# Patient Record
Sex: Male | Born: 1970 | Race: Black or African American | Hispanic: No | Marital: Married
Health system: Southern US, Community
[De-identification: ages and names within clinical notes are randomized; demographics above are authoritative.]

---

## 2018-07-23 ENCOUNTER — Emergency Department: Payer: 59

## 2018-07-23 ENCOUNTER — Encounter: Payer: Self-pay | Admitting: Emergency Medicine

## 2018-07-23 ENCOUNTER — Emergency Department
Admission: EM | Admit: 2018-07-23 | Discharge: 2018-07-23 | Disposition: A | Discharge status: Home / Self Care | Payer: 59 | Attending: Emergency Medicine | Admitting: Emergency Medicine

## 2018-07-23 DIAGNOSIS — R002 Palpitations: Secondary | ICD-10-CM | POA: Diagnosis present

## 2018-07-23 DIAGNOSIS — R42 Dizziness and giddiness: Secondary | ICD-10-CM | POA: Insufficient documentation

## 2018-07-23 DIAGNOSIS — R202 Paresthesia of skin: Secondary | ICD-10-CM | POA: Insufficient documentation

## 2018-07-23 LAB — CBC
HCT: 41.4 % (ref 40.0–52.0)
HEMOGLOBIN: 14.3 g/dL (ref 13.0–18.0)
MCH: 30.1 pg (ref 26.0–34.0)
MCHC: 34.6 g/dL (ref 32.0–36.0)
MCV: 87 fL (ref 80.0–100.0)
Platelets: 267 10*3/uL (ref 150–440)
RBC: 4.75 MIL/uL (ref 4.40–5.90)
RDW: 14.2 % (ref 11.5–14.5)
WBC: 3.8 10*3/uL (ref 3.8–10.6)

## 2018-07-23 LAB — TSH: TSH: 3.045 u[IU]/mL (ref 0.350–4.500)

## 2018-07-23 LAB — BASIC METABOLIC PANEL
ANION GAP: 8 (ref 5–15)
BUN: 17 mg/dL (ref 6–20)
CALCIUM: 9 mg/dL (ref 8.9–10.3)
CO2: 26 mmol/L (ref 22–32)
Chloride: 104 mmol/L (ref 98–111)
Creatinine, Ser: 1.13 mg/dL (ref 0.61–1.24)
Glucose, Bld: 109 mg/dL — ABNORMAL HIGH (ref 70–99)
Potassium: 3.4 mmol/L — ABNORMAL LOW (ref 3.5–5.1)
Sodium: 138 mmol/L (ref 135–145)

## 2018-07-23 LAB — TROPONIN I

## 2018-07-23 NOTE — Discharge Instructions (Addendum)
Please make an appointment with your primary care physician for follow-up.  Please check your blood pressures daily, as we discussed, and bring the record with you to your primary care physician's appointment.  Please have your primary care physician follow-up the results of your thyroid testing today.  Return to the emergency department if you develop severe pain, lightheadedness or fainting, shortness of breath, fever, or any other symptoms concerning to you.

## 2018-07-23 NOTE — ED Notes (Signed)
Pt presents to ED via POV with c/o chest pressure, nausea, and feeling flushed after getting off of a call at work. Pt reports recently his BP has been slightly increased. Pt states he felt like his heart was racing and he felt flushed. Pt is alert and oriented, NAD noted at this time, skin warm, dry, and intact.

## 2018-07-23 NOTE — ED Triage Notes (Signed)
Pt reports hung up from a conference call at work and started feeling flushed and like his heart was beating fast and then slow and just irregular.

## 2018-07-23 NOTE — ED Notes (Signed)
NAD noted at time of D/C. Pt denies questions or concerns. Pt ambulatory to the lobby at this time.  

## 2018-07-23 NOTE — ED Provider Notes (Signed)
[facility] Emergency Department Provider Note  ____________________________________________  Time seen: Approximately 12:42 PM  I have reviewed the triage vital signs and the nursing notes.   HISTORY  Chief Complaint Irregular Heart Beat; Chest Pain; and Nausea    HPI Caleb Coffey is a 47 y.o. male, otherwise healthy, presenting with palpitations.  The patient reports that he stayed up late last night, and then had a large coffee upon arriving to work.  After phone call, at 1030 this morning, the patient developed central palpitations with bilateral hand tingling and felt lightheaded when he stood up.  He did not have any chest pain, shortness of breath, syncope.  The symptoms have significantly improved.  He went for routine medical screening at his job yesterday and had a blood pressure that was elevated, which has made him worried about hypertension.  However, when he has been using his blood pressure cuff at home, his results have been reassuring.  SH: Denies tobacco or cocaine; works in [employer] at [employer]  FH: Father and grandfather with CAD/stroke in their 7050s and 4260s.  History reviewed. No pertinent past medical history.  There are no active problems to display for this patient.   History reviewed. No pertinent surgical history.    Allergies Patient has no allergy information on record.  No family history on file.  Social History Social History   Tobacco Use  . Smoking status: Not on file  Substance Use Topics  . Alcohol use: Not on file  . Drug use: Not on file    Review of Systems Constitutional: No fever/chills.  Positive lightheadedness, no syncope.  No diaphoresis. Eyes: No visual changes.  No blurred or double vision peer ENT: No sore throat. No congestion or rhinorrhea. Cardiovascular: Denies chest pain.  Positive palpitations. Respiratory: Denies shortness of breath.  No cough. Gastrointestinal: No abdominal pain.  No nausea, no  vomiting.  No diarrhea.  No constipation. Genitourinary: Negative for dysuria. Musculoskeletal: Negative for back pain. Skin: Negative for rash. Neurological: Negative for headaches. No focal numbness, or weakness.  Hand tingling    ____________________________________________   PHYSICAL EXAM:  VITAL SIGNS: ED Triage Vitals  Enc Vitals Group     BP 07/23/18 1046 (!) 164/88     Pulse Rate 07/23/18 1046 92     Resp 07/23/18 1046 20     Temp 07/23/18 1046 98.7 F (37.1 C)     Temp src --      SpO2 07/23/18 1046 100 %     Weight 07/23/18 1040 167 lb (75.8 kg)     Height 07/23/18 1040 5\' 5"  (1.651 m)     Head Circumference --      Peak Flow --      Pain Score 07/23/18 1039 6     Pain Loc --      Pain Edu? --      Excl. in GC? --     Constitutional: Alert and oriented. Answers questions appropriately. Anxious appearing. Eyes: Conjunctivae are normal.  EOMI. No scleral icterus. Head: Atraumatic. Nose: No congestion/rhinnorhea. Mouth/Throat: Mucous membranes are moist.  Neck: No stridor.  Supple.  No JVD. Cardiovascular: Normal rate, regular rhythm. No murmurs, rubs or gallops.  Respiratory: Normal respiratory effort.  No accessory muscle use or retractions. Lungs CTAB.  No wheezes, rales or ronchi. Gastrointestinal: Soft, nontender and nondistended.  No guarding or rebound.  No peritoneal signs. Musculoskeletal: No LE edema. No ttp in the calves or palpable cords.  Negative  Homan's sign. Neurologic:  A&Ox3.  Speech is clear.  Face and smile are symmetric.  EOMI.  Moves all extremities well. Skin:  Skin is warm, dry and intact. No rash noted. Psychiatric: Mood normal; mild anxious affect. Speech and behavior are normal.  Normal judgement.  ____________________________________________   LABS (all labs ordered are listed, but only abnormal results are displayed)  Labs Reviewed  BASIC METABOLIC PANEL - Abnormal; Notable for the following components:      Result Value    Potassium 3.4 (*)    Glucose, Bld 109 (*)    All other components within normal limits  CBC  TROPONIN I  TSH   ____________________________________________  EKG  ED ECG REPORT I, Rockne MenghiniNorman, Anne-Caroline, the attending physician, personally viewed and interpreted this ECG.   Date: 07/23/2018  EKG Time: 1035  Rate: 95  Rhythm: normal sinus rhythm; incomplete RBBB  Axis: normal  Intervals:none  ST&T Change: No STEMI  ____________________________________________  RADIOLOGY  Dg Chest 2 View  Result Date: 07/23/2018 CLINICAL DATA:  Left-sided chest pain. EXAM: CHEST - 2 VIEW COMPARISON:  None. FINDINGS: The heart size and mediastinal contours are within normal limits. Both lungs are clear. The visualized skeletal structures are unremarkable. IMPRESSION: No active cardiopulmonary disease. Electronically Signed   By: Obie DredgeWilliam T Derry M.D.   On: 07/23/2018 11:24    ____________________________________________   PROCEDURES  Procedure(s) performed: None  Procedures  Critical Care performed: No ____________________________________________   INITIAL IMPRESSION / ASSESSMENT AND PLAN / ED COURSE  Pertinent labs & imaging results that were available during my care of the patient were reviewed by me and considered in my medical decision making (see chart for details).  47 y.o. male, otherwise healthy, with some family history of vessel disease, presenting for palpitations, bilateral hand tingling, and lightheadedness.  Overall, the patient is hemodynamically stable and is not hypertensive here; his blood pressure is 136/83 while relaxed and at rest.  His EKG does not show any ischemic changes or arrhythmia.  His laboratory studies are reassuring with normal electrolytes, and no evidence of anemia or elevation in his white blood cell count.  His troponin is negative.  His chest x-ray does not show any acute process.  A thyroid panel is pending and will be followed up with his primary care  physician.  It is possible that the patient had an episode that was related to his sleep deprivation with large caffeine intake.  I do not see any evidence for ACS or MI.  At this time, the patient is safe for discharge home.  He will follow-up with his primary care physician, and return precautions were discussed.  ____________________________________________  FINAL CLINICAL IMPRESSION(S) / ED DIAGNOSES  Final diagnoses:  Paresthesia of both hands  Lightheadedness  Palpitations         NEW MEDICATIONS STARTED DURING THIS VISIT:  New Prescriptions   No medications on file      Rockne MenghiniNorman, Anne-Caroline, MD 07/23/18 1254

## 2019-05-30 IMAGING — CR DG CHEST 2V
1 series · 2 of 2 positions shown · non-contrast
Comparison: None.

CLINICAL DATA: Left-sided chest pain.

EXAM:
CHEST - 2 VIEW

[Series 1: dg chest 2 view · 0.14mm/px · 2 of 2 slices shown]
[im 1/2]
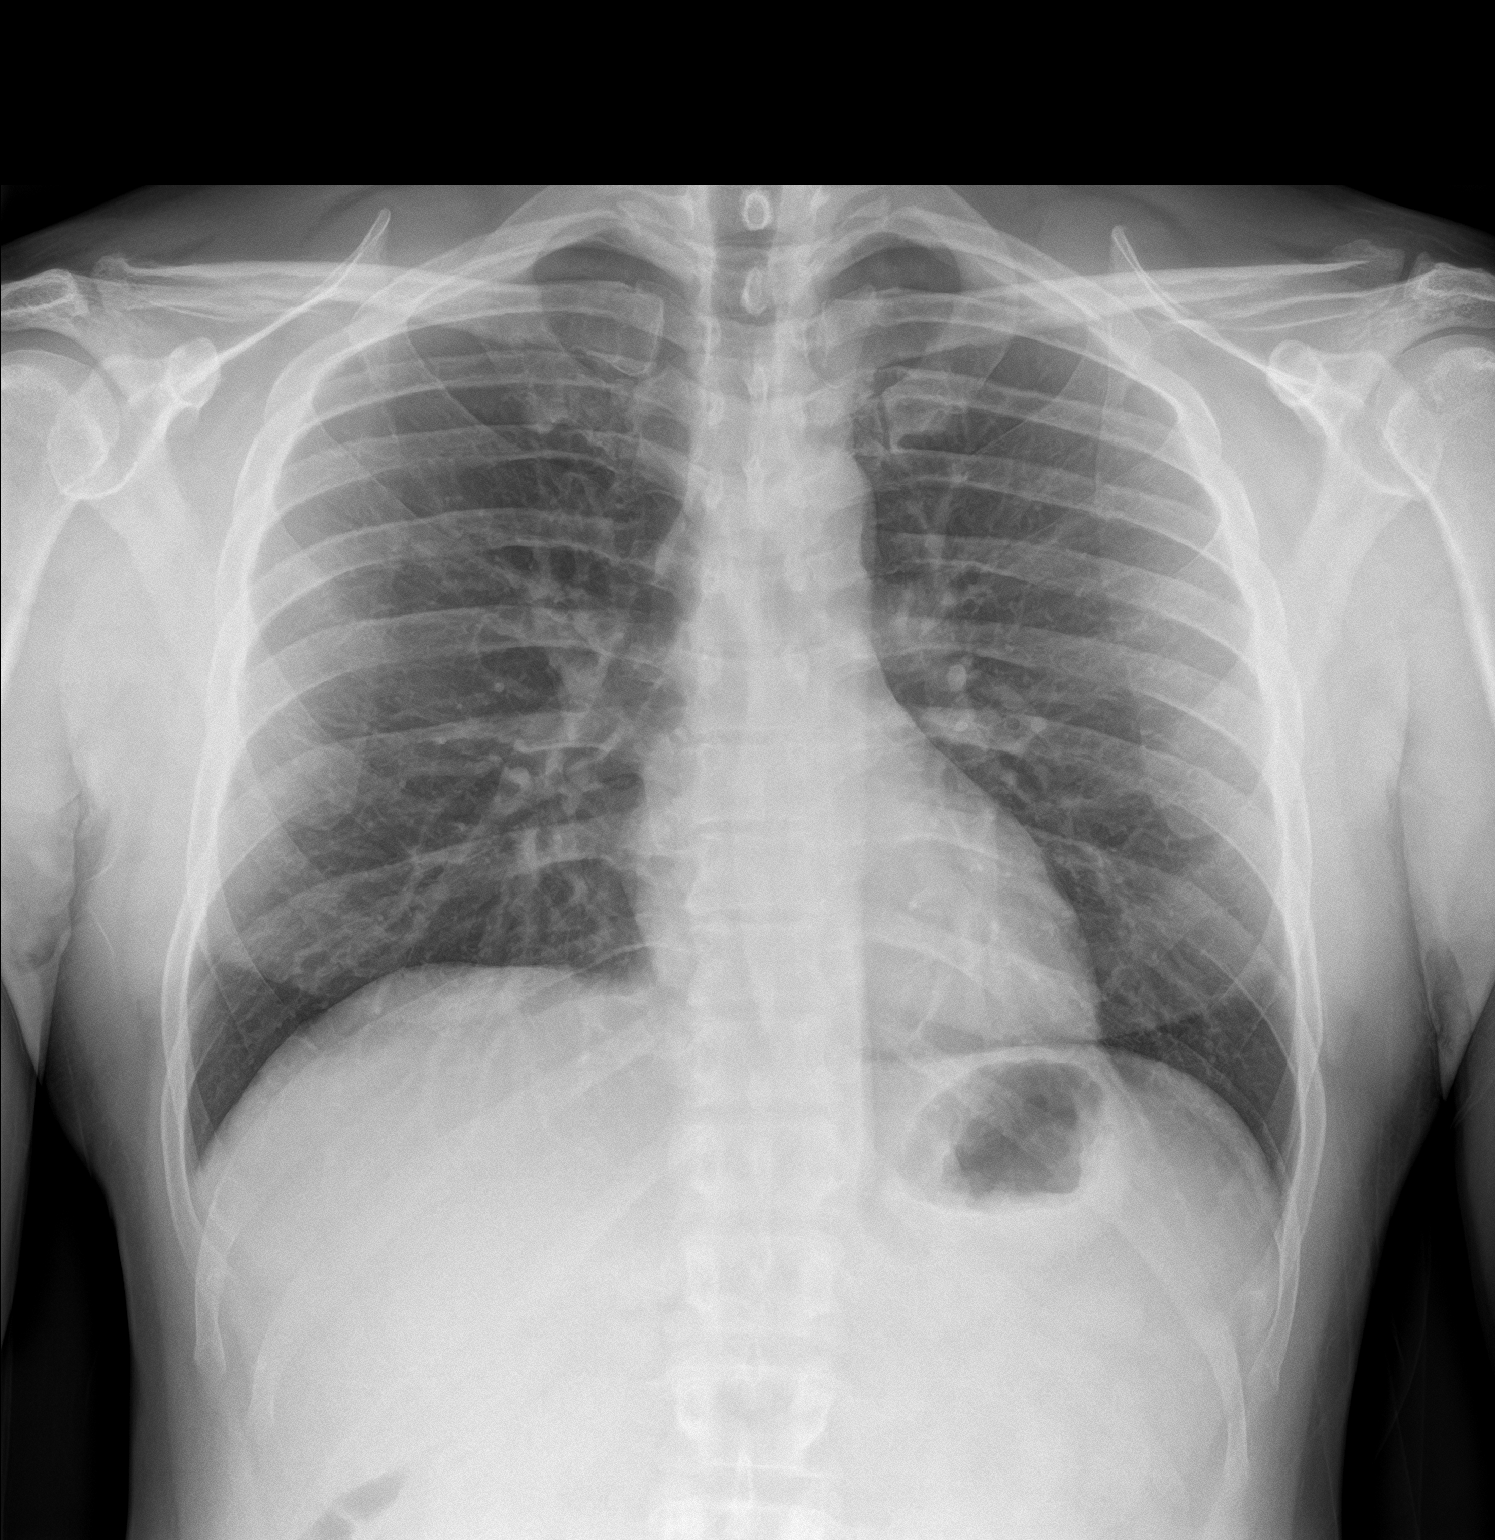
[im 2/2]
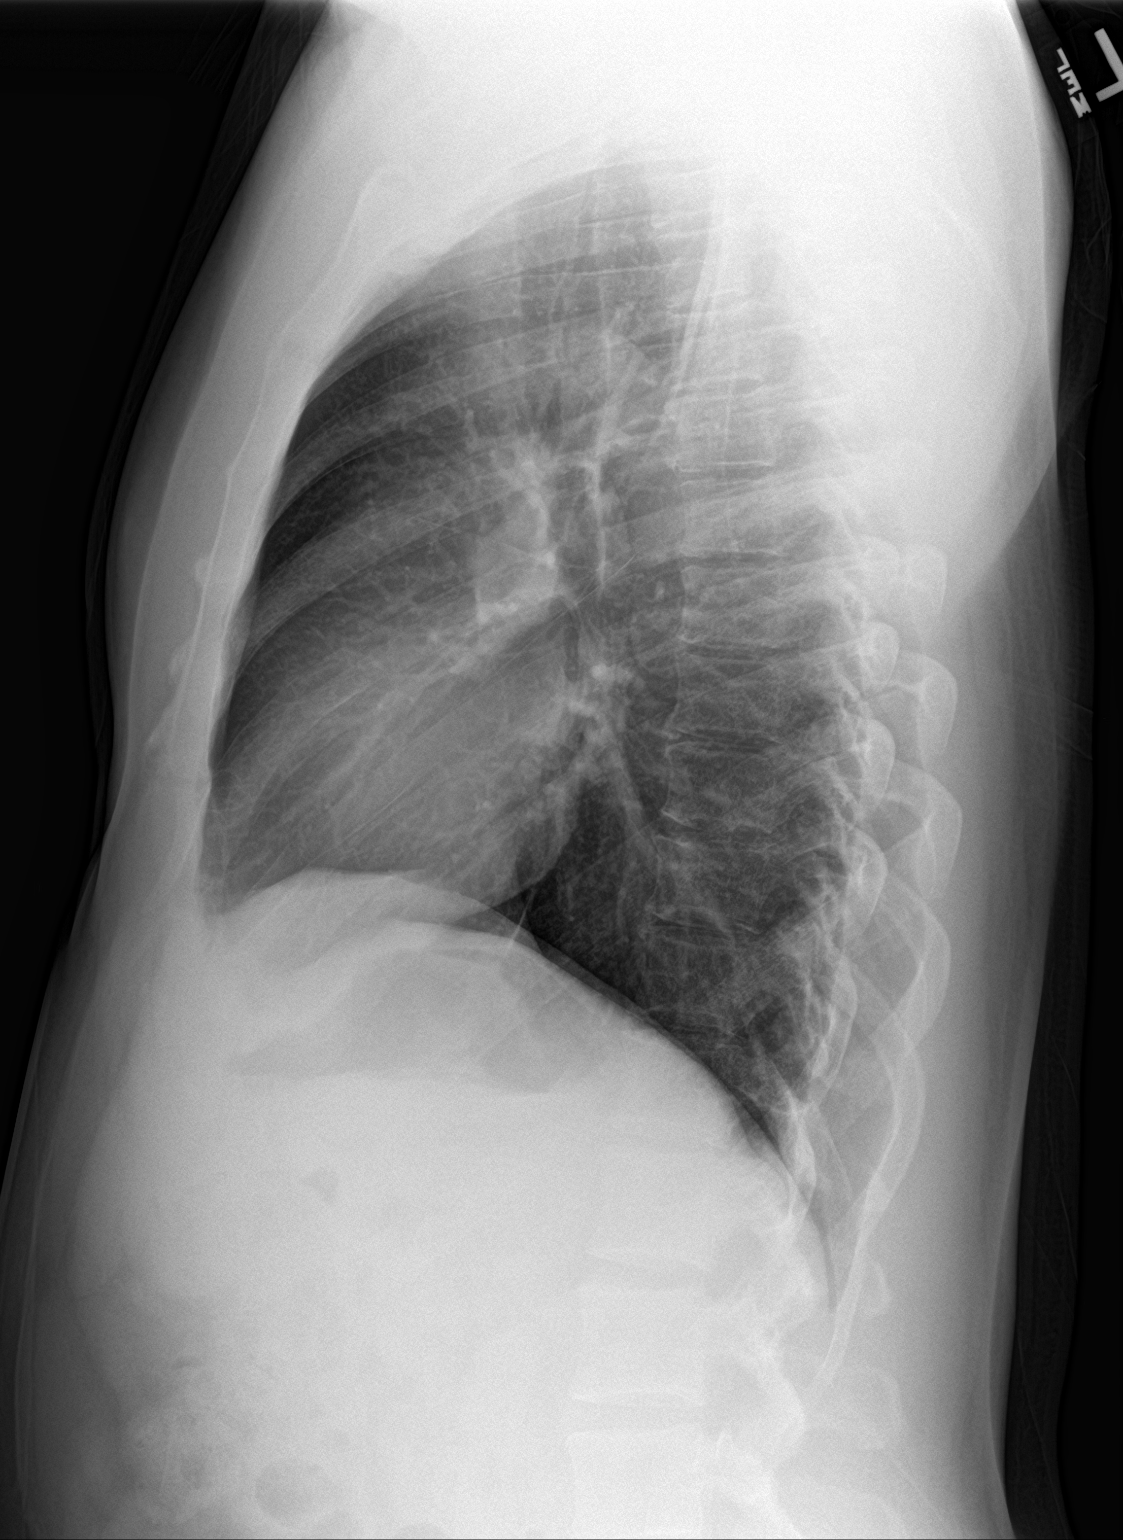

[2 of 2 positions shown; findings below may reference images not displayed]

FINDINGS: The heart size and mediastinal contours are within normal limits.
Both lungs are clear. The visualized skeletal structures are
unremarkable.
IMPRESSION: No active cardiopulmonary disease.
